# Patient Record
Sex: Male | Born: 2000 | Race: Black or African American | Hispanic: No | Marital: Single | Smoking: Never smoker
Health system: Southern US, Community
[De-identification: ages and names within clinical notes are randomized; demographics above are authoritative.]

## PROBLEM LIST (undated history)

## (undated) DIAGNOSIS — K565 Intestinal adhesions [bands], unspecified as to partial versus complete obstruction: Secondary | ICD-10-CM

## (undated) DIAGNOSIS — K219 Gastro-esophageal reflux disease without esophagitis: Secondary | ICD-10-CM

## (undated) HISTORY — DX: Intestinal adhesions (bands), unspecified as to partial versus complete obstruction: K56.50

---
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
## 2019-01-12 ENCOUNTER — Encounter: Payer: Self-pay | Admitting: Emergency Medicine

## 2019-01-12 ENCOUNTER — Other Ambulatory Visit: Payer: Self-pay

## 2019-01-12 ENCOUNTER — Emergency Department
Admission: EM | Admit: 2019-01-12 | Discharge: 2019-01-12 | Disposition: A | Discharge status: Home / Self Care | Payer: PRIVATE HEALTH INSURANCE | Attending: Emergency Medicine | Admitting: Emergency Medicine

## 2019-01-12 ENCOUNTER — Emergency Department: Payer: PRIVATE HEALTH INSURANCE

## 2019-01-12 DIAGNOSIS — E876 Hypokalemia: Secondary | ICD-10-CM | POA: Insufficient documentation

## 2019-01-12 DIAGNOSIS — Z79899 Other long term (current) drug therapy: Secondary | ICD-10-CM | POA: Insufficient documentation

## 2019-01-12 DIAGNOSIS — R0789 Other chest pain: Secondary | ICD-10-CM | POA: Insufficient documentation

## 2019-01-12 LAB — CBC
HCT: 45.8 % (ref 39.0–52.0)
Hemoglobin: 15.4 g/dL (ref 13.0–17.0)
MCH: 26.5 pg (ref 26.0–34.0)
MCHC: 33.6 g/dL (ref 30.0–36.0)
MCV: 78.8 fL — ABNORMAL LOW (ref 80.0–100.0)
Platelets: 213 10*3/uL (ref 150–400)
RBC: 5.81 MIL/uL (ref 4.22–5.81)
RDW: 11.8 % (ref 11.5–15.5)
WBC: 5.8 10*3/uL (ref 4.0–10.5)
nRBC: 0 % (ref 0.0–0.2)

## 2019-01-12 LAB — BASIC METABOLIC PANEL
Anion gap: 10 (ref 5–15)
BUN: 11 mg/dL (ref 6–20)
CO2: 30 mmol/L (ref 22–32)
Calcium: 9.3 mg/dL (ref 8.9–10.3)
Chloride: 99 mmol/L (ref 98–111)
Creatinine, Ser: 1.25 mg/dL — ABNORMAL HIGH (ref 0.61–1.24)
GFR calc Af Amer: >60 mL/min (ref >60)
GFR calc non Af Amer: >60 mL/min (ref >60)
Glucose, Bld: 143 mg/dL — ABNORMAL HIGH (ref 70–99)
Potassium: 3.2 mmol/L — ABNORMAL LOW (ref 3.5–5.1)
Sodium: 139 mmol/L (ref 135–145)

## 2019-01-12 LAB — TROPONIN I (HIGH SENSITIVITY): Troponin I (High Sensitivity): <2 ng/L (ref <18)

## 2019-01-12 LAB — TSH: TSH: 2.069 u[IU]/mL (ref 0.350–4.500)

## 2019-01-12 LAB — MAGNESIUM: Magnesium: 1.6 mg/dL — ABNORMAL LOW (ref 1.7–2.4)

## 2019-01-12 MED ORDER — POTASSIUM CHLORIDE ER 20 MEQ PO TBCR: 20.0000 meq | EXTENDED_RELEASE_TABLET | Freq: Every day | ORAL | 0 refills | Status: DC

## 2019-01-12 MED ORDER — PREDNISONE 20 MG PO TABS
60.0000 mg | ORAL_TABLET | Freq: Once | ORAL | Status: AC
  Administered 2019-01-12: 60 mg via ORAL
  Filled 2019-01-12: qty 3

## 2019-01-12 MED ORDER — PREDNISONE 20 MG PO TABS: ORAL_TABLET | ORAL | 0 refills | Status: AC

## 2019-01-12 MED ORDER — MAGNESIUM OXIDE 400 MG PO TABS: 400.0000 mg | ORAL_TABLET | Freq: Every day | ORAL | 0 refills | Status: AC

## 2019-01-12 NOTE — ED Triage Notes (Signed)
Patient reports chest pain that started a few hours ago. States pain is in the left side and center of chest. Patient reports history of costochondritis with similar symptoms.

## 2019-01-12 NOTE — Discharge Instructions (Addendum)
As we discussed, we are going to start you on prednisone.  While taking this, try to avoid ibuprofen or naproxen.  Take Tylenol 1000 mg every 6 hours as needed for pain.  While taking the steroid, I recommend taking over-the-counter famotidine or Pepcid.  1 tablet daily.  This will help with acid reflux or gastritis.  You can take melatonin to help with sleep.  Your potassium and magnesium were also low.  This is likely due to not eating a balanced diet.  Try to eat plenty of vegetables and fruit.  I have also given a short course of replacement to see if it helps.

## 2019-01-12 NOTE — ED Provider Notes (Signed)
[facility] Emergency Department Provider Note  ____________________________________________   First MD Initiated Contact with Patient 01/12/19 2054     (approximate)  I have reviewed the triage vital signs and the nursing notes.   HISTORY  Chief Complaint Chest Pain    HPI Kenneth Beltran is a 18 y.o. male  Here with atypical chest pain. Pt has an extensive h/o recent evaluations and visits for chest pain. He has been c/o intermittent, positional, reproducible sharp CP of his left parasternal area for months. He has had EGD and ortho eval. He reports that his sx had improved on naproxen recently but over the past day or so, has returned. He believes he had been lifting something when it came back, when he experienced a sharp pain in his left parasternal area. Pain is worse w/ movement of his arm and palpation. No alleviating factors. He has no h/o cardiac disease. No family h/o early heart disease. No SOB, palpitations, diaphoresis. No abd pain, n/v/d. No other complaints.        Past Medical History:  Diagnosis Date   GERD (gastroesophageal reflux disease)     Patient Active Problem List   Diagnosis Date Noted   Sickle cell trait (HCC) 11/22/2012   Atopic dermatitis 11/17/2011    Past Surgical History:  Procedure Laterality Date   ESOPHAGOGASTRODUODENOSCOPY (EGD) WITH PROPOFOL N/A 11/01/2018   Procedure: ESOPHAGOGASTRODUODENOSCOPY (EGD) WITH PROPOFOL;  Surgeon: Pasty Spillers, MD;  Location: [facility] ENDOSCOPY;  Service: Endoscopy;  Laterality: N/A;    Prior to Admission medications   Medication Sig Start Date End Date Taking? Authorizing Provider  magnesium oxide (MAG-OX) 400 MG tablet Take 1 tablet (400 mg total) by mouth daily for 7 days. 01/12/19 01/19/19  Shaune Pollack, MD  naproxen (NAPROSYN) 500 MG tablet Take 1 tablet (500 mg total) by mouth 2 (two) times daily with a meal. 12/19/18   Jolene Provost, MD  pantoprazole (PROTONIX) 20 MG  tablet Take 2 tablets (40 mg total) by mouth daily. 10/20/18   Tommie Sams, DO  potassium chloride 20 MEQ TBCR Take 20 mEq by mouth daily for 7 days. 01/12/19 01/19/19  Shaune Pollack, MD  predniSONE (DELTASONE) 20 MG tablet Take 3 tablets (60 mg total) by mouth daily for 3 days, THEN 2 tablets (40 mg total) daily for 3 days, THEN 1 tablet (20 mg total) daily for 3 days. 01/12/19 01/21/19  Shaune Pollack, MD  famotidine (PEPCID) 20 MG tablet Take by mouth. 08/31/18 10/20/18  [provider]    Allergies Patient has no known allergies.  Family History  Problem Relation Age of Onset   Healthy Mother    Healthy Father     Social History Social History   Tobacco Use   Smoking status: Never Smoker   Smokeless tobacco: Never Used  Substance Use Topics   Alcohol use: Not Currently   Drug use: Never    Review of Systems  Review of Systems  Constitutional: Negative for chills, fatigue and fever.  HENT: Negative for sore throat.   Respiratory: Positive for chest tightness. Negative for shortness of breath.   Cardiovascular: Positive for chest pain.  Gastrointestinal: Negative for abdominal pain.  Genitourinary: Negative for flank pain.  Musculoskeletal: Negative for neck pain.  Skin: Negative for rash and wound.  Allergic/Immunologic: Negative for immunocompromised state.  Neurological: Negative for weakness and numbness.  Hematological: Does not bruise/bleed easily.  All other systems reviewed and are negative.    ____________________________________________  PHYSICAL  EXAM:      VITAL SIGNS: ED Triage Vitals  Enc Vitals Group     BP 01/12/19 1802 (!) 155/83     Pulse Rate 01/12/19 1802 (!) 107     Resp 01/12/19 1802 16     Temp 01/12/19 1802 98.4 F (36.9 C)     Temp Source 01/12/19 1802 Oral     SpO2 01/12/19 1802 100 %     Weight 01/12/19 1803 185 lb (83.9 kg)     Height 01/12/19 1803 5\' 9"  (1.753 m)     Head Circumference --      Peak Flow --       Pain Score 01/12/19 1803 6     Pain Loc --      Pain Edu? --      Excl. in [place]? --      Physical Exam Vitals signs and nursing note reviewed.  Constitutional:      General: He is not in acute distress.    Appearance: He is well-developed.  HENT:     Head: Normocephalic and atraumatic.  Eyes:     Conjunctiva/sclera: Conjunctivae normal.  Neck:     Musculoskeletal: Neck supple.  Cardiovascular:     Rate and Rhythm: Normal rate and regular rhythm.     Heart sounds: Normal heart sounds. No murmur. No friction rub.  Pulmonary:     Effort: Pulmonary effort is normal. No respiratory distress.     Breath sounds: Normal breath sounds. No wheezing or rales.  Chest:    Abdominal:     General: There is no distension.     Palpations: Abdomen is soft.     Tenderness: There is no abdominal tenderness.  Skin:    General: Skin is warm.     Capillary Refill: Capillary refill takes less than 2 seconds.  Neurological:     Mental Status: He is alert and oriented to person, place, and time.     Motor: No abnormal muscle tone.       ____________________________________________   LABS (all labs ordered are listed, but only abnormal results are displayed)  Labs Reviewed  BASIC METABOLIC PANEL - Abnormal; Notable for the following components:      Result Value   Potassium 3.2 (*)    Glucose, Bld 143 (*)    Creatinine, Ser 1.25 (*)    All other components within normal limits  CBC - Abnormal; Notable for the following components:   MCV 78.8 (*)    All other components within normal limits  MAGNESIUM - Abnormal; Notable for the following components:   Magnesium 1.6 (*)    All other components within normal limits  TSH  TROPONIN I (HIGH SENSITIVITY)    ____________________________________________  EKG: Sinus tachycardia, VR 113. When compared to prior, ST changes resolved and no signs of ischemia or infarct. ________________________________________  RADIOLOGY All imaging,  including plain films, CT scans, and ultrasounds, independently reviewed by me, and interpretations confirmed via formal radiology reads.  ED MD interpretation:   CXR: Clear  Official radiology report(s): Dg Chest 2 View  Result Date: 01/12/2019 CLINICAL DATA:  Patient reports chest pain that started a few hours ago. States pain is in the left side and center of chest. Patient reports history of costochondritis with similar symptoms EXAM: CHEST - 2 VIEW COMPARISON:  Chest radiograph 11/16/2018 FINDINGS: The heart size and mediastinal contours are within normal limits. The lungs are clear. No pneumothorax or pleural effusion. The visualized skeletal structures are  unremarkable. IMPRESSION: No active cardiopulmonary disease. Electronically Signed   By: Emmaline KluverNancy  Ballantyne M.D.   On: 01/12/2019 18:39    ____________________________________________  PROCEDURES   Procedure(s) performed (including Critical Care):  Procedures  ____________________________________________  INITIAL IMPRESSION / MDM / ASSESSMENT AND PLAN / ED COURSE  As part of my medical decision making, I reviewed the following data within the electronic MEDICAL RECORD NUMBER Notes from prior ED visits and [place] Controlled Substance Database      *Rozanna BoxCaleb Awbrey was evaluated in Emergency Department on 01/13/2019 for the symptoms described in the history of present illness. He was evaluated in the context of the global COVID-19 pandemic, which necessitated consideration that the patient might be at risk for infection with the SARS-CoV-2 virus that causes COVID-19. Institutional protocols and algorithms that pertain to the evaluation of patients at risk for COVID-19 are in a state of rapid change based on information released by regulatory bodies including the CDC and federal and state organizations. These policies and algorithms were followed during the patient's care in the ED.  Some ED evaluations and interventions may be delayed as a  result of limited staffing during the pandemic.*      Medical Decision Making:  18 yo M here with recurrent, ongoing reproducible CP. This has undergone extensive work-up and is being treated as suspected costochondritis. Suspect he has some ongoing underlying inflammation given transient improvement w/ naproxen, which is now worsened as he is no longer taking it. Pain is reproducible, sharp, and positional. EKG nonischemic, and trop is negative making ACS Highly unlikely. He has no cough, SOB, hemoptysis or signs of PE and has had neg CT Angio for this in the past. No signs of CHF or cardiomegaly on CXR or exam. Of note, he incidentally has low K and Mag on labs but I suspect this is 2/2 poor diet, and focal recurrent CP would be atypical presentation for symptomatic hypokalemia, but will replace.  Had a long discussion with pt and pt's PCP. At this time, suspect ongoing MSK inflammation contributing to recurrent intercostal strain/spasm w/ costochondritis. Will trial oral steroids and advise OTC antacids for now. Will f/u with PCP. No signs of ACS, dissection, PE, or other emergent pathology at this time.  ____________________________________________  FINAL CLINICAL IMPRESSION(S) / ED DIAGNOSES  Final diagnoses:  Chest wall pain  Hypokalemia     MEDICATIONS GIVEN DURING THIS VISIT:  Medications  predniSONE (DELTASONE) tablet 60 mg (60 mg Oral Given 01/12/19 2207)     ED Discharge Orders         Ordered    predniSONE (DELTASONE) 20 MG tablet     01/12/19 2237    magnesium oxide (MAG-OX) 400 MG tablet  Daily     01/12/19 2237    potassium chloride 20 MEQ TBCR  Daily     01/12/19 2237           Note:  This document was prepared using Dragon voice recognition software and may include unintentional dictation errors.   Shaune PollackIsaacs, Sayler Mickiewicz, MD 01/13/19 [phone]

## 2019-02-15 ENCOUNTER — Other Ambulatory Visit: Payer: Self-pay

## 2019-02-15 ENCOUNTER — Telehealth: Payer: PRIVATE HEALTH INSURANCE | Admitting: Family Medicine

## 2019-02-15 DIAGNOSIS — Z20822 Contact with and (suspected) exposure to covid-19: Secondary | ICD-10-CM

## 2019-02-17 LAB — NOVEL CORONAVIRUS, NAA: SARS-CoV-2, NAA: NOT DETECTED

## 2019-07-03 ENCOUNTER — Ambulatory Visit: Payer: PRIVATE HEALTH INSURANCE | Admitting: Family

## 2019-07-03 ENCOUNTER — Other Ambulatory Visit: Payer: Self-pay

## 2019-07-03 DIAGNOSIS — H1032 Unspecified acute conjunctivitis, left eye: Secondary | ICD-10-CM

## 2019-07-04 NOTE — Progress Notes (Signed)
   Acute Office Visit  Subjective:    Patient ID: Robel Wuertz, male    DOB: 2000/11/29, 19 y.o.   MRN: [id number]  Chief Complaint  Patient presents with  . Conjunctivitis    HPI Patient presents virtually with c/o pink eye.  He developed left eye irritation 2 days ago after his football game.  No otc tx.  Mild drainage throughout the day.  No itching.  No Known Allergies  Review of Systems  Eyes: Positive for discharge.       Sensitive and red/irritated       Objective:    Physical Exam Constitutional:      Appearance: Normal appearance.  Eyes:     General: Lids are normal. Vision grossly intact.     Comments: Conjunctivae and sclera inflamed.  No active drainage.  Neurological:     Mental Status: He is alert.     There were no vitals taken for this visit.     Assessment & Plan:   Problem List Items Addressed This Visit    None    Visit Diagnoses    Acute bacterial conjunctivitis of left eye    -  Primary     Reviewed exam findings.  Gent 0.3% opth gtts called into [facility] in [place].  Keep hands off face.  Wash hands before and after gtt administration.  Follow up if no improvement in 3-5 days.  Shantea Poulton, Deirdre Peer, NP

## 2022-07-12 ENCOUNTER — Emergency Department: Payer: 59

## 2022-07-12 ENCOUNTER — Encounter: Payer: Self-pay | Admitting: Emergency Medicine

## 2022-07-12 ENCOUNTER — Emergency Department
Admission: EM | Admit: 2022-07-12 | Discharge: 2022-07-12 | Disposition: A | Discharge status: Home / Self Care | Payer: 59 | Attending: Emergency Medicine | Admitting: Emergency Medicine

## 2022-07-12 ENCOUNTER — Other Ambulatory Visit: Payer: Self-pay

## 2022-07-12 DIAGNOSIS — E876 Hypokalemia: Secondary | ICD-10-CM | POA: Diagnosis not present

## 2022-07-12 DIAGNOSIS — R1031 Right lower quadrant pain: Secondary | ICD-10-CM | POA: Diagnosis present

## 2022-07-12 DIAGNOSIS — K529 Noninfective gastroenteritis and colitis, unspecified: Secondary | ICD-10-CM | POA: Insufficient documentation

## 2022-07-12 LAB — CBC
HCT: 47.9 % (ref 39.0–52.0)
Hemoglobin: 15.5 g/dL (ref 13.0–17.0)
MCH: 26.2 pg (ref 26.0–34.0)
MCHC: 32.4 g/dL (ref 30.0–36.0)
MCV: 81 fL (ref 80.0–100.0)
Platelets: 231 10*3/uL (ref 150–400)
RBC: 5.91 MIL/uL — ABNORMAL HIGH (ref 4.22–5.81)
RDW: 11.6 % (ref 11.5–15.5)
WBC: 11.2 10*3/uL — ABNORMAL HIGH (ref 4.0–10.5)
nRBC: 0 % (ref 0.0–0.2)

## 2022-07-12 LAB — COMPREHENSIVE METABOLIC PANEL
ALT: 17 U/L (ref 0–44)
AST: 28 U/L (ref 15–41)
Albumin: 4.3 g/dL (ref 3.5–5.0)
Alkaline Phosphatase: 39 U/L (ref 38–126)
Anion gap: 10 (ref 5–15)
BUN: 10 mg/dL (ref 6–20)
CO2: 24 mmol/L (ref 22–32)
Calcium: 8.9 mg/dL (ref 8.9–10.3)
Chloride: 100 mmol/L (ref 98–111)
Creatinine, Ser: 1.19 mg/dL (ref 0.61–1.24)
GFR, Estimated: >60 mL/min (ref >60)
Glucose, Bld: 131 mg/dL — ABNORMAL HIGH (ref 70–99)
Potassium: 2.8 mmol/L — ABNORMAL LOW (ref 3.5–5.1)
Sodium: 134 mmol/L — ABNORMAL LOW (ref 135–145)
Total Bilirubin: 1.6 mg/dL — ABNORMAL HIGH (ref 0.3–1.2)
Total Protein: 7.7 g/dL (ref 6.5–8.1)

## 2022-07-12 LAB — URINALYSIS, ROUTINE W REFLEX MICROSCOPIC
Bilirubin Urine: NEGATIVE
Glucose, UA: NEGATIVE mg/dL
Hgb urine dipstick: NEGATIVE
Ketones, ur: NEGATIVE mg/dL
Leukocytes,Ua: NEGATIVE
Nitrite: NEGATIVE
Protein, ur: NEGATIVE mg/dL
Specific Gravity, Urine: >1.046 — ABNORMAL HIGH (ref 1.005–1.030)
pH: 5 (ref 5.0–8.0)

## 2022-07-12 LAB — LIPASE, BLOOD: Lipase: 30 U/L (ref 11–51)

## 2022-07-12 MED ORDER — POTASSIUM CHLORIDE 10 MEQ/100ML IV SOLN
10.0000 meq | Freq: Once | INTRAVENOUS | Status: AC
  Administered 2022-07-12: 10 meq via INTRAVENOUS
  Filled 2022-07-12: qty 100

## 2022-07-12 MED ORDER — DICYCLOMINE HCL 10 MG PO CAPS
10.0000 mg | ORAL_CAPSULE | Freq: Once | ORAL | Status: AC
  Administered 2022-07-12: 10 mg via ORAL
  Filled 2022-07-12: qty 1

## 2022-07-12 MED ORDER — IOHEXOL 300 MG/ML  SOLN
100.0000 mL | Freq: Once | INTRAMUSCULAR | Status: AC | PRN
  Administered 2022-07-12: 100 mL via INTRAVENOUS

## 2022-07-12 MED ORDER — POTASSIUM CHLORIDE 20 MEQ PO PACK: 40.0000 meq | PACK | Freq: Two times a day (BID) | ORAL | Status: DC

## 2022-07-12 MED ORDER — MORPHINE SULFATE (PF) 4 MG/ML IV SOLN
4.0000 mg | Freq: Once | INTRAVENOUS | Status: AC
  Administered 2022-07-12: 4 mg via INTRAVENOUS
  Filled 2022-07-12: qty 1

## 2022-07-12 MED ORDER — ONDANSETRON HCL 4 MG/2ML IJ SOLN
4.0000 mg | Freq: Once | INTRAMUSCULAR | Status: AC
  Administered 2022-07-12: 4 mg via INTRAVENOUS
  Filled 2022-07-12: qty 2

## 2022-07-12 MED ORDER — ONDANSETRON HCL 4 MG PO TABS: 4.0000 mg | ORAL_TABLET | Freq: Three times a day (TID) | ORAL | 0 refills | Status: DC | PRN

## 2022-07-12 MED ORDER — KETOROLAC TROMETHAMINE 15 MG/ML IJ SOLN
15.0000 mg | Freq: Once | INTRAMUSCULAR | Status: AC
  Administered 2022-07-12: 15 mg via INTRAVENOUS
  Filled 2022-07-12: qty 1

## 2022-07-12 NOTE — ED Provider Notes (Signed)
[facility] Provider Note    Event Date/Time   First MD Initiated Contact with Patient 07/12/22 1610     (approximate)   History   Abdominal Pain   HPI  Kenneth Beltran is a 22 y.o. male who presents today for evaluation of right lower quadrant pain that began this morning.  Patient reports that he had 1 episode of vomiting, and loose stools today.  No blood in his emesis or stool.  No fevers.  He has no testicular pain or swelling.  No urinary symptoms.  No back or flank pain.  He denies any recent travel.  No recent antibiotics.  Patient Active Problem List   Diagnosis Date Noted   Sickle cell trait 11/22/2012   Atopic dermatitis 11/17/2011          Physical Exam   Triage Vital Signs: ED Triage Vitals  Enc Vitals Group     BP 07/12/22 1523 135/85     Pulse Rate 07/12/22 1523 81     Resp 07/12/22 1523 18     Temp --      Temp src --      SpO2 07/12/22 1523 97 %     Weight --      Height --      Head Circumference --      Peak Flow --      Pain Score 07/12/22 1522 9     Pain Loc --      Pain Edu? --      Excl. in GC? --     Most recent vital signs: Vitals:   07/12/22 1523 07/12/22 1808  BP: 135/85 122/64  Pulse: 81 (!) 51  Resp: 18 14  Temp:  97.8 F (36.6 C)  SpO2: 97% 97%    Physical Exam Vitals and nursing note reviewed.  Constitutional:      General: Awake and alert. No acute distress.    Appearance: Normal appearance. The patient is normal weight.  HENT:     Head: Normocephalic and atraumatic.     Mouth: Mucous membranes are moist.  Eyes:     General: PERRL. Normal EOMs        Right eye: No discharge.        Left eye: No discharge.     Conjunctiva/sclera: Conjunctivae normal.  Cardiovascular:     Rate and Rhythm: Normal rate and regular rhythm.     Pulses: Normal pulses.  Pulmonary:     Effort: Pulmonary effort is normal. No respiratory distress.     Breath sounds: Normal breath sounds.  Abdominal:      Abdomen is soft. There is mild left lower quadrant and right lower quadrant abdominal tenderness. No rebound or guarding. No distention. Musculoskeletal:        General: No swelling. Normal range of motion.     Cervical back: Normal range of motion and neck supple.  Skin:    General: Skin is warm and dry.     Capillary Refill: Capillary refill takes less than 2 seconds.     Findings: No rash.  Neurological:     Mental Status: The patient is awake and alert.      ED Results / Procedures / Treatments   Labs (all labs ordered are listed, but only abnormal results are displayed) Labs Reviewed  COMPREHENSIVE METABOLIC PANEL - Abnormal; Notable for the following components:      Result Value   Sodium 134 (*)    Potassium 2.8 (*)  Glucose, Bld 131 (*)    Total Bilirubin 1.6 (*)    All other components within normal limits  CBC - Abnormal; Notable for the following components:   WBC 11.2 (*)    RBC 5.91 (*)    All other components within normal limits  URINALYSIS, ROUTINE W REFLEX MICROSCOPIC - Abnormal; Notable for the following components:   Color, Urine YELLOW (*)    APPearance CLEAR (*)    Specific Gravity, Urine >1.046 (*)    All other components within normal limits  LIPASE, BLOOD     EKG     RADIOLOGY I independently reviewed and interpreted imaging and agree with radiologists findings.     PROCEDURES:  Critical Care performed:   Procedures   MEDICATIONS ORDERED IN ED: Medications  potassium chloride (KLOR-CON) packet 40 mEq (has no administration in time range)  morphine (PF) 4 MG/ML injection 4 mg (4 mg Intravenous Given 07/12/22 1625)  ondansetron (ZOFRAN) injection 4 mg (4 mg Intravenous Given 07/12/22 1625)  iohexol (OMNIPAQUE) 300 MG/ML solution 100 mL (100 mLs Intravenous Contrast Given 07/12/22 1637)  potassium chloride 10 mEq in 100 mL IVPB (0 mEq Intravenous Stopped 07/12/22 1827)  dicyclomine (BENTYL) capsule 10 mg (10 mg Oral Given 07/12/22 1728)   ketorolac (TORADOL) 15 MG/ML injection 15 mg (15 mg Intravenous Given 07/12/22 1804)     IMPRESSION / MDM / ASSESSMENT AND PLAN / ED COURSE  I reviewed the triage vital signs and the nursing notes.   Differential diagnosis includes, but is not limited to, appendicitis, gastroenteritis, diverticulitis, electrolyte disarray.  Patient is awake and alert, hemodynamically stable and afebrile.  I reviewed the patient's chart.  No recent emergency department visits.  Patient is overall well-appearing.  Labs are overall reassuring with the exception of hypokalemia to 2.8, likely in the setting of his vomiting and diarrhea.  Advised against taking any further potassium supplements at home as this is corrected in the emergency department, we discussed the risks of hyperkalemia as well.  His hypokalemia was repleted with oral and IV potassium.  He has a mild leukocytosis to 11.1.  Given his abdominal tenderness in his lower quadrants, CT scan was obtained for evaluation of appendicitis or other intra-abdominal abnormality.  CT scan reveals dilated loops of bowel consistent with diarrheal illness.  His vomiting and diarrhea are non-bloody. Patient is hemodynamically stable. No history of immunosuppression, no other red flags such as recent travel, sick contacts or recent antibiotic use. Improved with treatment in the emergency department and tolerating oral intake. Differential diagnosis is broad however with his normal CT scan, and given his improvement in the emergency department, low concern that the patient has surgical process in abdomen. Given that patient has a paucity of red flags for the vomiting and diarrhea as it pertains to patient's past medical history and history of present illness, no indication for further observation or empiric antibiotic treatment.  Recommended bland diet and hydration, and close outpatient follow-up.  Discussed care plan, return precautions, and advised close outpatient  follow-up. Patient agrees with plan of care.   Patient's presentation is most consistent with acute complicated illness / injury requiring diagnostic workup.      FINAL CLINICAL IMPRESSION(S) / ED DIAGNOSES   Final diagnoses:  Gastroenteritis  Hypokalemia     Rx / DC Orders   ED Discharge Orders          Ordered    ondansetron (ZOFRAN) 4 MG tablet  Every 8 hours PRN  07/12/22 1749             Note:  This document was prepared using Dragon voice recognition software and may include unintentional dictation errors.   Jackelyn Hoehnoggi, Naomii Kreger E, PA-C 07/12/22 [id number]    Corena HerterMumma, Shannon, MD 07/13/22 [phone]

## 2022-07-12 NOTE — Discharge Instructions (Addendum)
You were seen in the Emergency Department for your abdominal pain. Your vital signs and physical exam were normal, you were observed and there is no evidence of any serious medical problems. We checked lab work including your blood counts and general chemistries and these were all normal today without any remarkable abnormalities.  We provided you with IV fluids and medications to help treat your symptoms.  It is most likely that your symptoms today are caused by a viral infection and should improve over the next few days.  It is important that you maintain a gentle diet with plenty of clear fluids.  You can use the medication we provided you for symptomatic relief of the nausea.  You should stay well hydrated. -- Rest, Drink lots of fluids. Advance diet as tolerated, if your have worsening abdominal pain, if you are unable to keep fluids down, should you develop fever greater than 100.4, return to the Emergency Department. If you have any concerns at all, please return to the Emergency Department. -- For the next 48 hours maintain hydration with at least 8 glasses of fluid per day of clear juices- apple or cranberry, broth, jell-O, popsicles, sherbert, tea, ginger ale and dry toast and crackers. -- After 48 hours Progress your diet to soft BRAT Diet: rice, bananas, applesauce and toast: then mashed potatoes and vegetables, -- Advance as tolerated, vegetables, eggs, chicken, fish, fruit -- Refrain from dairy products, fatty greasy, spicy food for one week If you have increasing abdominal pain, bright red blood from rectum, uncontrollable nausea and vomiting, fever > 100.71F, chills, inability to hold down liquids, decrease in urination, or any symptoms

## 2022-07-12 NOTE — ED Triage Notes (Signed)
Patient to ED via POV for lower abd pain and 1 episode of vomiting. Pain started today with last BM earlier today.

## 2022-07-13 ENCOUNTER — Other Ambulatory Visit: Payer: Self-pay

## 2022-07-13 ENCOUNTER — Encounter: Payer: Self-pay | Admitting: Emergency Medicine

## 2022-07-13 DIAGNOSIS — R112 Nausea with vomiting, unspecified: Secondary | ICD-10-CM | POA: Insufficient documentation

## 2022-07-13 DIAGNOSIS — R1030 Lower abdominal pain, unspecified: Secondary | ICD-10-CM | POA: Diagnosis not present

## 2022-07-13 LAB — CBC WITH DIFFERENTIAL/PLATELET
Abs Immature Granulocytes: 0.04 10*3/uL (ref 0.00–0.07)
Basophils Absolute: 0 10*3/uL (ref 0.0–0.1)
Basophils Relative: 0 %
Eosinophils Absolute: 0 10*3/uL (ref 0.0–0.5)
Eosinophils Relative: 0 %
HCT: 52.2 % — ABNORMAL HIGH (ref 39.0–52.0)
Hemoglobin: 17.8 g/dL — ABNORMAL HIGH (ref 13.0–17.0)
Immature Granulocytes: 0 %
Lymphocytes Relative: 7 %
Lymphs Abs: 0.9 10*3/uL (ref 0.7–4.0)
MCH: 26.4 pg (ref 26.0–34.0)
MCHC: 34.1 g/dL (ref 30.0–36.0)
MCV: 77.6 fL — ABNORMAL LOW (ref 80.0–100.0)
Monocytes Absolute: 1 10*3/uL (ref 0.1–1.0)
Monocytes Relative: 7 %
Neutro Abs: 11.8 10*3/uL — ABNORMAL HIGH (ref 1.7–7.7)
Neutrophils Relative %: 86 %
Platelets: 289 10*3/uL (ref 150–400)
RBC: 6.73 MIL/uL — ABNORMAL HIGH (ref 4.22–5.81)
RDW: 11.6 % (ref 11.5–15.5)
WBC: 13.8 10*3/uL — ABNORMAL HIGH (ref 4.0–10.5)
nRBC: 0 % (ref 0.0–0.2)

## 2022-07-13 NOTE — ED Notes (Signed)
Pt stated he was unable to provided a urine sample at this time. PT given a labeled cup and instructions to return the specimen cup when he is able to pee in it.   Pt and family member provided 2 warm blankets each for comfort, and wheeled back out into the lobby.

## 2022-07-13 NOTE — ED Triage Notes (Signed)
Patient ambulatory to triage with steady gait, without difficulty or distress noted; pt reports lower abd pain since yesterday accomp by N/V and constipation (last BM on Monday); seen for same yesterday and dx with gastroenteritis

## 2022-07-14 ENCOUNTER — Ambulatory Visit: Payer: PRIVATE HEALTH INSURANCE | Admitting: Oncology

## 2022-07-14 ENCOUNTER — Emergency Department
Admission: EM | Admit: 2022-07-14 | Discharge: 2022-07-14 | Disposition: A | Discharge status: Home / Self Care | Payer: PRIVATE HEALTH INSURANCE | Attending: Emergency Medicine | Admitting: Emergency Medicine

## 2022-07-14 DIAGNOSIS — R112 Nausea with vomiting, unspecified: Secondary | ICD-10-CM

## 2022-07-14 LAB — COMPREHENSIVE METABOLIC PANEL
ALT: 18 U/L (ref 0–44)
AST: 36 U/L (ref 15–41)
Albumin: 5.3 g/dL — ABNORMAL HIGH (ref 3.5–5.0)
Alkaline Phosphatase: 47 U/L (ref 38–126)
Anion gap: 17 — ABNORMAL HIGH (ref 5–15)
BUN: 17 mg/dL (ref 6–20)
CO2: 31 mmol/L (ref 22–32)
Calcium: 10.2 mg/dL (ref 8.9–10.3)
Chloride: 85 mmol/L — ABNORMAL LOW (ref 98–111)
Creatinine, Ser: 1.28 mg/dL — ABNORMAL HIGH (ref 0.61–1.24)
GFR, Estimated: >60 mL/min (ref >60)
Glucose, Bld: 148 mg/dL — ABNORMAL HIGH (ref 70–99)
Potassium: 3.6 mmol/L (ref 3.5–5.1)
Sodium: 133 mmol/L — ABNORMAL LOW (ref 135–145)
Total Bilirubin: 2.1 mg/dL — ABNORMAL HIGH (ref 0.3–1.2)
Total Protein: 9.4 g/dL — ABNORMAL HIGH (ref 6.5–8.1)

## 2022-07-14 LAB — URINALYSIS, ROUTINE W REFLEX MICROSCOPIC
Bilirubin Urine: NEGATIVE
Glucose, UA: 50 mg/dL — AB
Hgb urine dipstick: NEGATIVE
Ketones, ur: NEGATIVE mg/dL
Leukocytes,Ua: NEGATIVE
Nitrite: NEGATIVE
Protein, ur: 100 mg/dL — AB
Specific Gravity, Urine: 1.018 (ref 1.005–1.030)
pH: 5 (ref 5.0–8.0)

## 2022-07-14 LAB — LIPASE, BLOOD: Lipase: 25 U/L (ref 11–51)

## 2022-07-14 MED ORDER — METOCLOPRAMIDE HCL 5 MG/ML IJ SOLN
10.0000 mg | Freq: Once | INTRAMUSCULAR | Status: AC
  Administered 2022-07-14: 10 mg via INTRAVENOUS
  Filled 2022-07-14: qty 2

## 2022-07-14 MED ORDER — LACTATED RINGERS IV BOLUS
1000.0000 mL | Freq: Once | INTRAVENOUS | Status: AC
  Administered 2022-07-14: 1000 mL via INTRAVENOUS

## 2022-07-14 MED ORDER — KETOROLAC TROMETHAMINE 30 MG/ML IJ SOLN
15.0000 mg | Freq: Once | INTRAMUSCULAR | Status: AC
  Administered 2022-07-14: 15 mg via INTRAVENOUS
  Filled 2022-07-14: qty 1

## 2022-07-14 MED ORDER — DROPERIDOL 2.5 MG/ML IJ SOLN
2.5000 mg | Freq: Once | INTRAMUSCULAR | Status: AC
  Administered 2022-07-14: 2.5 mg via INTRAVENOUS
  Filled 2022-07-14: qty 2

## 2022-07-14 MED ORDER — METOCLOPRAMIDE HCL 10 MG PO TABS: 10.0000 mg | ORAL_TABLET | Freq: Four times a day (QID) | ORAL | 1 refills | Status: DC | PRN

## 2022-07-14 NOTE — Discharge Instructions (Signed)
As we discussed, please pick up the prescriptions for the nausea medications.  The metoclopramide/Reglan prescribed tonight can be helpful for nausea and hiccups.   The Zofran/odansetron that was prescribed previously can also be helpful for nausea.  Please take Tylenol and ibuprofen/Advil for your pain.  It is safe to take them together, or to alternate them every few hours.  Take up to 1000mg  of Tylenol at a time, up to 4 times per day.  Do not take more than 4000 mg of Tylenol in 24 hours.  For ibuprofen, take 400-600 mg, 3 - 4 times per day.  If you develop fevers or worsening symptoms despite the above medications and please return to the ED.  Otherwise, drink plenty fluids and get some rest

## 2022-07-14 NOTE — ED Notes (Addendum)
Pt in BR actively vomiting dark emesis, pt pale & diaphoretic; abd soft/nondist, tender to lower rt and left quadrants with palpation, describes as sharpe & crampy; st has been vomiting "black" emesis at home; denies any ETOH or drug use; st took miralax and MOM without results today; pt to triage and IV intiated

## 2022-07-14 NOTE — ED Provider Notes (Signed)
[facility] Provider Note    Event Date/Time   First MD Initiated Contact with Patient 07/14/22 0019     (approximate)   History   Abdominal Pain   HPI  Kenneth Beltran is a 22 y.o. male who presents to the ED for evaluation of Abdominal Pain   I reviewed ED visit from yesterday, a little bit more than 24 hours ago where he was seen for the same.  Had blood work and a contrasted CT scan of the abdomen/pelvis.  Diagnosed with likely gastroenteritis and discharged with Zofran prescription.  Patient returns to the ED with his mother for evaluation of continued lower abdominal cramping with nausea, but now with emesis.  Denies having emesis prior to today.  No other changes to his symptoms.  No fevers.  Reports 5 or 6 episodes of nonbloody nonbilious emesis.  Has not yet filled the prescription for Zofran that was provided yesterday.  No medications prior to arrival  Physical Exam   Triage Vital Signs: ED Triage Vitals [07/13/22 2338]  Enc Vitals Group     BP (!) 145/107     Pulse Rate 86     Resp 18     Temp 97.7 F (36.5 C)     Temp Source Oral     SpO2 97 %     Weight 190 lb (86.2 kg)     Height 5\' 9"  (1.753 m)     Head Circumference      Peak Flow      Pain Score 9     Pain Loc      Pain Edu?      Excl. in GC?     Most recent vital signs: Vitals:   07/14/22 0030 07/14/22 0434  BP: 132/76   Pulse: 61 66  Resp:  18  Temp:  97.9 F (36.6 C)  SpO2: 96% 97%    General: Awake, no distress.  CV:  Good peripheral perfusion.  Resp:  Normal effort.  Abd:  No distention.  Diffuse and mild lower abdominal tenderness without peritoneal features.  Upper abdomen is benign.  No guarding. MSK:  No deformity noted.  Neuro:  No focal deficits appreciated. Other:     ED Results / Procedures / Treatments   Labs (all labs ordered are listed, but only abnormal results are displayed) Labs Reviewed  CBC WITH DIFFERENTIAL/PLATELET - Abnormal;  Notable for the following components:      Result Value   WBC 13.8 (*)    RBC 6.73 (*)    Hemoglobin 17.8 (*)    HCT 52.2 (*)    MCV 77.6 (*)    Neutro Abs 11.8 (*)    All other components within normal limits  COMPREHENSIVE METABOLIC PANEL - Abnormal; Notable for the following components:   Sodium 133 (*)    Chloride 85 (*)    Glucose, Bld 148 (*)    Creatinine, Ser 1.28 (*)    Total Protein 9.4 (*)    Albumin 5.3 (*)    Total Bilirubin 2.1 (*)    Anion gap 17 (*)    All other components within normal limits  URINALYSIS, ROUTINE W REFLEX MICROSCOPIC - Abnormal; Notable for the following components:   Color, Urine YELLOW (*)    APPearance HAZY (*)    Glucose, UA 50 (*)    Protein, ur 100 (*)    Bacteria, UA RARE (*)    All other components within normal limits  LIPASE, BLOOD  EKG   RADIOLOGY   Official radiology report(s): No results found.  PROCEDURES and INTERVENTIONS:  Procedures  Medications  lactated ringers bolus 1,000 mL (0 mLs Intravenous Stopped 07/14/22 0315)  droperidol (INAPSINE) 2.5 MG/ML injection 2.5 mg (2.5 mg Intravenous Given 07/14/22 0036)  ketorolac (TORADOL) 30 MG/ML injection 15 mg (15 mg Intravenous Given 07/14/22 0045)  metoCLOPramide (REGLAN) injection 10 mg (10 mg Intravenous Given 07/14/22 0046)  metoCLOPramide (REGLAN) injection 10 mg (10 mg Intravenous Given 07/14/22 0346)     IMPRESSION / MDM / ASSESSMENT AND PLAN / ED COURSE  I reviewed the triage vital signs and the nursing notes.  Differential diagnosis includes, but is not limited to, gastroenteritis, dehydration, appendicitis, cannabis hyperemesis syndrome, SBO  {Patient presents with symptoms of an acute illness or injury that is potentially life-threatening.  Healthy 22 year old presents with continued symptoms of a likely gastroenteritis.  Look systemically well and has some mild diffuse lower abdominal tenderness without peritoneal features or guarding.  Normal vital signs.   Blood work with changes that I suspect are due to his new emesis today and poor intake with hemoconcentration on his CBC, hypochloremia on his metabolic panel.  Urine without infectious features and normal lipase.  Resolving symptoms with IV antiemetics and Toradol alongside fluids.  He is tolerating p.o. intake.  I considered and I discussed with the patient utility of repeat CT imaging and ultimately shared decision making yields plan not to pursue repeat imaging, which think is reasonable.  I doubt appendicitis considering the reassuring nature of his study yesterday.  I suspect is developing leukocytosis more related to his hemoconcentration from relative dehydration from his emesis.  We discussed return precautions.  Discharged with Reglan to help with his hiccups.  Clinical Course as of 07/14/22 0439  Wed Jul 14, 2022  0153 Reassessed and reexamined.  Reports feeling better. [DS]  0357 Reassessed.  Reports his hiccups are coming back but overall feels better than presentation still.  Exam is similar. [DS]  0423 Reassessed.  Tolerating p.o. intake.  Hiccups again improving with Reglan.  Reexamined.  We discussed plan of care.  Again discussed the utility of CT imaging and he defers at this point preferring to go home, which I think is reasonable [DS]    Clinical Course User Index [DS] Delton Prairie, MD     FINAL CLINICAL IMPRESSION(S) / ED DIAGNOSES   Final diagnoses:  Nausea and vomiting, unspecified vomiting type     Rx / DC Orders   ED Discharge Orders          Ordered    metoCLOPramide (REGLAN) 10 MG tablet  Every 6 hours PRN        07/14/22 0415             Note:  This document was prepared using Dragon voice recognition software and may include unintentional dictation errors.   Delton Prairie, MD 07/14/22 [phone]

## 2022-07-16 HISTORY — PX: LAPAROSCOPY ABDOMEN DIAGNOSTIC: PRO50

## 2022-07-24 ENCOUNTER — Ambulatory Visit (INDEPENDENT_AMBULATORY_CARE_PROVIDER_SITE_OTHER): Payer: PRIVATE HEALTH INSURANCE | Admitting: Medical

## 2022-07-24 ENCOUNTER — Other Ambulatory Visit: Payer: Self-pay

## 2022-07-24 ENCOUNTER — Ambulatory Visit
Admission: RE | Admit: 2022-07-24 | Discharge: 2022-07-24 | Disposition: A | Discharge status: Home / Self Care | Payer: PRIVATE HEALTH INSURANCE | Source: Ambulatory Visit | Attending: Medical | Admitting: Medical

## 2022-07-24 ENCOUNTER — Encounter: Payer: Self-pay | Admitting: Medical

## 2022-07-24 VITALS — BP 128/82 | HR 85 | Temp 99.5°F | Ht 68.31 in | Wt 176.0 lb

## 2022-07-24 DIAGNOSIS — R152 Fecal urgency: Secondary | ICD-10-CM

## 2022-07-24 DIAGNOSIS — K5900 Constipation, unspecified: Secondary | ICD-10-CM | POA: Diagnosis present

## 2022-07-24 DIAGNOSIS — R1030 Lower abdominal pain, unspecified: Secondary | ICD-10-CM

## 2022-07-24 NOTE — Progress Notes (Signed)
[facility] [address] [place], [place] [address] Phone: [phone] Fax: [phone]   Office Visit Note  Patient Name: Kenneth Beltran  Date of Birth:January 27, 2001  Med Rec number [id number]  Date of Service: 07/24/2022  Allergies: Patient has no known allergies.  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  He is S/P abdominal laparoscopy on 4/12 for small bowel obstruction. Had lysis of abdominal adhesions which were causing kinking of intestine. Per patient/notes, had several large liquid BM before leaving hospital on 4/14. Liquid BM had continued until approximately 48 hours ago. Was not given narcotic pain medicine to use after leaving hospital.   Despite having liquid BMs, still has had frequent urges to have BM last 48 hours accompanied by lower abdominal pressure and cramping about every 20 minutes. Took Miralax and Milk of Magnesia both once 2 days ago. Used a saline enema and a glycerin suppository yesterday without any relief. Only clear liquid returned with enema.  No nausea or vomiting. Denies fever or chills. Appetite has remained low since surgery, so has not been eating much..  Has virtual F/U visit in 48 hours with surgeon. Has not tried Social research officer, government regarding current symptoms. Did call nurse line this AM, states he was told to seek in-person evaluation.    Current Medication:  Outpatient Encounter Medications as of 07/24/2022  Medication Sig   [DISCONTINUED] famotidine (PEPCID) 20 MG tablet Take by mouth.   [DISCONTINUED] gentamicin (GARAMYCIN) 0.3 % ophthalmic ointment Place into the left eye in the morning, at noon, in the evening, and at bedtime.   [DISCONTINUED] metoCLOPramide (REGLAN) 10 MG tablet Take 1 tablet (10 mg total) by mouth every 6 (six) hours as needed for nausea or vomiting (hiccups).   [DISCONTINUED]  naproxen (NAPROSYN) 500 MG tablet Take 1 tablet (500 mg total) by mouth 2 (two) times daily with a meal.   [DISCONTINUED] ondansetron (ZOFRAN) 4 MG tablet Take 1 tablet (4 mg total) by mouth every 8 (eight) hours as needed for nausea or vomiting.   [DISCONTINUED] pantoprazole (PROTONIX) 20 MG tablet Take 2 tablets (40 mg total) by mouth daily.   [DISCONTINUED] potassium chloride 20 MEQ TBCR Take 20 mEq by mouth daily for 7 days.   No facility-administered encounter medications on file as of 07/24/2022.      Medical History: Past Medical History:  Diagnosis Date   GERD (gastroesophageal reflux disease)    Small bowel obstruction due to adhesions    S/P surgical adhesiolysis 07/2021     Vital Signs: BP 128/82 (BP Location: Right Arm, Patient Position: Sitting)   Pulse 85   Temp 99.5 F (37.5 C) (Tympanic)   Ht 5' 8.31" (1.735 m)   Wt 176 lb (79.8 kg)   SpO2 99%   BMI 26.52 kg/m    Review of Systems  Constitutional:  Positive for appetite change. Negative for chills and fever.  Respiratory:  Negative for shortness of breath.   Cardiovascular:  Negative for chest pain.  Gastrointestinal:  Positive for abdominal distention (mild per patient), abdominal pain, constipation and diarrhea. Negative for anal bleeding, blood in stool, nausea, rectal pain and vomiting.  Genitourinary:  Negative for difficulty urinating, dysuria, frequency and urgency.    Physical Exam Vitals reviewed.  Constitutional:      General: He is not in acute distress.  Appearance: He is not ill-appearing.  HENT:     Head: Normocephalic.     Mouth/Throat:     Mouth: Mucous membranes are moist.     Pharynx: Oropharynx is clear. No pharyngeal swelling or posterior oropharyngeal erythema.  Cardiovascular:     Rate and Rhythm: Normal rate and regular rhythm.     Heart sounds: No murmur heard.    No friction rub. No gallop.  Pulmonary:     Effort: Pulmonary effort is normal.     Breath sounds: Normal  breath sounds. No wheezing, rhonchi or rales.  Abdominal:     General: Bowel sounds are normal. There is distension (Slightly).     Palpations: Abdomen is soft. There is no mass.     Tenderness: There is abdominal tenderness (L>R) in the right lower quadrant and left lower quadrant. There is no guarding.     Comments: 3 small healing surgical incisions (umbilical, RUQ and suprapubic) with dermabond intact. No evidence of wound dehiscence. No erythema, swelling or drainage around wounds.  Genitourinary:    Rectum: Normal. No mass or tenderness.     Comments: No stool palpable on digital rectal exam. Normal sphincter tone. No hemorrhoid or anal fissure noted. Musculoskeletal:     Cervical back: Neck supple. No rigidity.  Lymphadenopathy:     Cervical: No cervical adenopathy.  Neurological:     Mental Status: He is alert.     Assessment/Plan: 1. Constipation, unspecified constipation type 2. Lower abdominal pain 3. Fecal urgency Question whether there may still be a burden of hard/dense stool built up secondary to recent obstruction which has yet to be passed. Am not currently suspicious for recurrent obstruction. Will get KUB abdominal xray to assess further. Recommended 1 scoop Miralax in water/juice twice daily and saline enema once daily until follow up with surgeon in 48 hours or until symptoms relieved. Patient also encouraged to call surgeon's office to speak with on-call surgeon if possible. Will follow up with xray result when available. Advised to present to ED with worsening abdominal pain, vomiting, fever or blood in stool.  - DG Abd 1 View; Future     General Counseling: [name] verbalizes understanding of the findings of todays visit and agrees with plan of treatment. he has been encouraged to call the office with any questions or concerns that should arise related to todays visit.   Orders Placed This Encounter  Procedures   DG Abd 1 View    Time spent:35 Minutes  (including time spent with patient, reviewing prior notes and documenting visit)   Jonathon Resides PA-C [facility] 07/24/2022 9:53 AM

## 2022-07-24 NOTE — Patient Instructions (Addendum)
-  Go to [facility] as discussed to have your x-ray.  Bring your insurance care, photo ID and credit card with you.  We should receive the result of your x-ray within 48 hours after it is performed.  We will notify you of the result through MyChart.   -Take 1 serving of MiraLAX (17 g) every 12 hours until your follow-up visit with the surgeon in 2 days or until your symptoms are relieved. -Use a saline enema today and again tomorrow.   -Call your surgeon's office to speak with the on-call doctor regarding your symptoms.  -Go to the emergency department with worsening abdominal pain, vomiting, fever or blood in stool.

## 2022-08-06 ENCOUNTER — Encounter: Payer: Self-pay | Admitting: Family Medicine

## 2022-08-06 ENCOUNTER — Ambulatory Visit (INDEPENDENT_AMBULATORY_CARE_PROVIDER_SITE_OTHER): Payer: PRIVATE HEALTH INSURANCE | Admitting: Family Medicine

## 2022-08-06 VITALS — BP 120/80 | HR 98 | Temp 99.4°F | Wt 172.0 lb

## 2022-08-06 DIAGNOSIS — R5082 Postprocedural fever: Secondary | ICD-10-CM | POA: Diagnosis not present

## 2022-08-06 DIAGNOSIS — R5383 Other fatigue: Secondary | ICD-10-CM

## 2022-08-06 DIAGNOSIS — R109 Unspecified abdominal pain: Secondary | ICD-10-CM

## 2022-08-06 NOTE — Progress Notes (Unsigned)
[facility] [address] [place], [place] [address] Phone: [phone] Fax: [phone]   Office Visit Note  Patient Name: Kenneth Beltran  Date of Birth:19-Mar-2001  Med Rec number [id number]  Date of Service: 08/07/2022  Patient has no known allergies.  Chief Complaint  Patient presents with   Acute Visit     Notes from prior visit to [facility] as well as notes and labs from [facility] reviewed prior to patinet's visit and discussed with Briant at the visit. Underlying cause for his initial adhesions leading to small bowel obstruction still not clear.   Last 2 days fatigue, chills, abdominal cramps Possibly improving today but still wants to be checked before the weekend given the post-surgical complication of his retroperitoneal fluid collection Still getting some minimal drainage from the hole where the drain fell out Finished augmentin yesterday Some sense of stool urgency but no actual diarrhea or constipation - stool color normal      Current Medication:  Outpatient Encounter Medications as of 08/06/2022  Medication Sig   [DISCONTINUED] famotidine (PEPCID) 20 MG tablet Take by mouth.   No facility-administered encounter medications on file as of 08/06/2022.      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  Gastrointestinal:  Positive for abdominal pain.    Physical Exam Vitals reviewed.  Constitutional:      General: He is not in acute distress.    Appearance: Normal appearance.  Abdominal:     General: A surgical scar is present. Bowel sounds are normal.     Palpations: Abdomen is soft. There is  splenomegaly. There is no shifting dullness, hepatomegaly or mass.     Tenderness: There is no abdominal tenderness. There is no right CVA tenderness or left CVA tenderness. Negative signs include Murphy's sign and McBurney's sign.       Comments: Laparoscopy scars on abdomen - not tender or inflamed  Dressing over drain wound on back not disturbed - no erythema, hardness, or tenderness of the area - minimal drainage dried on dressing  Neurological:     Mental Status: He is alert.       Assessment/Plan:  1. Abdominal pain, unspecified abdominal location  - CBC w/Diff - Comp. Metabolic Panel (14) - Lipase  2. Other fatigue  - CBC w/Diff - Comp. Metabolic Panel (14) - Lipase - Vitamin D (25 hydroxy)  3. Postsurgical fever Has follow-up in person next week with his [facility] surgical team    Follow-up with me dependent on lab results  General Counseling: [name] verbalizes understanding of the findings of todays visit and agrees with plan of treatment. I have discussed any further diagnostic evaluation that may be needed or ordered today. We also reviewed his medications today. he has been encouraged to call the office with any questions or concerns that should arise related to todays visit.   Orders Placed This Encounter  Procedures   CBC w/Diff   Comp. Metabolic Panel (14)   Lipase   Vitamin D (25 hydroxy)    No orders of the defined  types were placed in this encounter.   Dr Evette Doffing Mckaylah Bettendorf [facility] University Physician

## 2022-08-07 LAB — COMP. METABOLIC PANEL (14)
ALT: 64 IU/L — ABNORMAL HIGH (ref 0–44)
AST: 27 IU/L (ref 0–40)
Albumin/Globulin Ratio: 0.9 — ABNORMAL LOW (ref 1.2–2.2)
Albumin: 3.7 g/dL — ABNORMAL LOW (ref 4.3–5.2)
Alkaline Phosphatase: 83 IU/L (ref 44–121)
BUN/Creatinine Ratio: 14 (ref 9–20)
BUN: 13 mg/dL (ref 6–20)
Bilirubin Total: 0.4 mg/dL (ref 0.0–1.2)
CO2: 27 mmol/L (ref 20–29)
Calcium: 9.3 mg/dL (ref 8.7–10.2)
Chloride: 96 mmol/L (ref 96–106)
Creatinine, Ser: 0.95 mg/dL (ref 0.76–1.27)
Globulin, Total: 3.9 g/dL (ref 1.5–4.5)
Glucose: 73 mg/dL (ref 70–99)
Potassium: 4.2 mmol/L (ref 3.5–5.2)
Sodium: 137 mmol/L (ref 134–144)
Total Protein: 7.6 g/dL (ref 6.0–8.5)
eGFR: 115 mL/min/{1.73_m2} (ref >59)

## 2022-08-07 LAB — CBC WITH DIFFERENTIAL/PLATELET
Basophils Absolute: 0.1 10*3/uL (ref 0.0–0.2)
Basos: 0 %
EOS (ABSOLUTE): 0.1 10*3/uL (ref 0.0–0.4)
Eos: 1 %
Hematocrit: 38.9 % (ref 37.5–51.0)
Hemoglobin: 12.6 g/dL — ABNORMAL LOW (ref 13.0–17.7)
Immature Grans (Abs): 0.1 10*3/uL (ref 0.0–0.1)
Immature Granulocytes: 1 %
Lymphocytes Absolute: 1.6 10*3/uL (ref 0.7–3.1)
Lymphs: 12 %
MCH: 25.7 pg — ABNORMAL LOW (ref 26.6–33.0)
MCHC: 32.4 g/dL (ref 31.5–35.7)
MCV: 79 fL (ref 79–97)
Monocytes Absolute: 1.3 10*3/uL — ABNORMAL HIGH (ref 0.1–0.9)
Monocytes: 10 %
Neutrophils Absolute: 10.6 10*3/uL — ABNORMAL HIGH (ref 1.4–7.0)
Neutrophils: 76 %
Platelets: 434 10*3/uL (ref 150–450)
RBC: 4.91 x10E6/uL (ref 4.14–5.80)
RDW: 12.1 % (ref 11.6–15.4)
WBC: 13.8 10*3/uL — ABNORMAL HIGH (ref 3.4–10.8)

## 2022-08-07 LAB — LIPASE: Lipase: 47 U/L (ref 13–78)

## 2022-08-07 LAB — VITAMIN D 25 HYDROXY (VIT D DEFICIENCY, FRACTURES): Vit D, 25-Hydroxy: 20.1 ng/mL — ABNORMAL LOW (ref 30.0–100.0)
